# Patient Record
Sex: Female | Born: 1995 | Race: Black or African American | Hispanic: No | Marital: Single | Smoking: Never smoker
Health system: Southern US, Community
[De-identification: ages and names within clinical notes are randomized; demographics above are authoritative.]

## PROBLEM LIST (undated history)

## (undated) HISTORY — PX: FEMUR SURGERY: SHX943

---
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
## 2019-12-04 ENCOUNTER — Encounter: Payer: Self-pay | Admitting: Emergency Medicine

## 2019-12-04 ENCOUNTER — Other Ambulatory Visit: Payer: Self-pay

## 2019-12-04 ENCOUNTER — Emergency Department: Payer: Medicaid Other

## 2019-12-04 ENCOUNTER — Emergency Department
Admission: EM | Admit: 2019-12-04 | Discharge: 2019-12-04 | Disposition: A | Discharge status: Home / Self Care | Payer: Medicaid Other | Attending: Emergency Medicine | Admitting: Emergency Medicine

## 2019-12-04 DIAGNOSIS — O2311 Infections of bladder in pregnancy, first trimester: Secondary | ICD-10-CM | POA: Diagnosis not present

## 2019-12-04 DIAGNOSIS — M545 Low back pain: Secondary | ICD-10-CM | POA: Insufficient documentation

## 2019-12-04 DIAGNOSIS — Y92414 Local residential or business street as the place of occurrence of the external cause: Secondary | ICD-10-CM | POA: Diagnosis not present

## 2019-12-04 DIAGNOSIS — R109 Unspecified abdominal pain: Secondary | ICD-10-CM

## 2019-12-04 DIAGNOSIS — Y93I9 Activity, other involving external motion: Secondary | ICD-10-CM | POA: Insufficient documentation

## 2019-12-04 DIAGNOSIS — Y999 Unspecified external cause status: Secondary | ICD-10-CM | POA: Diagnosis not present

## 2019-12-04 DIAGNOSIS — O2312 Infections of bladder in pregnancy, second trimester: Secondary | ICD-10-CM

## 2019-12-04 DIAGNOSIS — Z3A01 Less than 8 weeks gestation of pregnancy: Secondary | ICD-10-CM | POA: Insufficient documentation

## 2019-12-04 DIAGNOSIS — O26899 Other specified pregnancy related conditions, unspecified trimester: Secondary | ICD-10-CM

## 2019-12-04 LAB — URINALYSIS, COMPLETE (UACMP) WITH MICROSCOPIC
Bilirubin Urine: NEGATIVE
Glucose, UA: NEGATIVE mg/dL
Hgb urine dipstick: NEGATIVE
Ketones, ur: 5 mg/dL — AB
Nitrite: NEGATIVE
Protein, ur: 30 mg/dL — AB
Specific Gravity, Urine: 1.034 — ABNORMAL HIGH (ref 1.005–1.030)
pH: 6 (ref 5.0–8.0)

## 2019-12-04 MED ORDER — CEPHALEXIN 500 MG PO CAPS: 500.0000 mg | ORAL_CAPSULE | Freq: Two times a day (BID) | ORAL | 0 refills | Status: AC
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
## 2019-12-04 NOTE — Discharge Instructions (Signed)
Please follow-up with your OB/GYN for any symptoms of concern.  Return to the emergency department if you have sudden onset of abdominal cramping, vaginal discharge, gush of fluids, or bleeding.

## 2019-12-07 LAB — URINE CULTURE: Culture: 10000 — AB
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
## 2023-07-02 ENCOUNTER — Ambulatory Visit
Admission: EM | Admit: 2023-07-02 | Discharge: 2023-07-02 | Disposition: A | Discharge status: Home / Self Care | Payer: Managed Care, Other (non HMO) | Attending: Nurse Practitioner | Admitting: Nurse Practitioner

## 2023-07-02 DIAGNOSIS — B349 Viral infection, unspecified: Secondary | ICD-10-CM

## 2023-07-02 DIAGNOSIS — Z1152 Encounter for screening for COVID-19: Secondary | ICD-10-CM | POA: Insufficient documentation

## 2023-07-02 LAB — POCT RAPID STREP A (OFFICE): Rapid Strep A Screen: NEGATIVE

## 2023-07-02 NOTE — ED Triage Notes (Signed)
Pt reports she has a scratchy throat, body aches, and hard to breath out of her mouth x 1 day.

## 2023-07-02 NOTE — Discharge Instructions (Addendum)
The rapid strep test was negative.  A throat culture and COVID test are pending.  You will be contacted if the pending test results are positive. Take medication as prescribed. Recommend over-the-counter ibuprofen or Tylenol as needed for pain, fever, or general discomfort. Increase fluids and allow for plenty of rest. Warm salt water gargles 3-4 times daily while symptoms persist. Also recommend a soft diet to include soup, broth, yogurt, pudding, or Jell-O while symptoms persist. Chloraseptic throat spray and throat lozenges are recommended to help with throat pain or discomfort. If symptoms or not improving over the next 7 to 10 days, or if they suddenly worsen, follow-up in this clinic or with your primary care physician for further evaluation. Follow-up as needed.

## 2023-07-02 NOTE — ED Provider Notes (Signed)
[facility]    CSN: [id number] Arrival date & time: 07/02/23  1206      History   Chief Complaint Chief Complaint  Patient presents with   Sore Throat    HPI Dominique Price is a 27 y.o. female.   The history is provided by the patient.   The patient presents with a 1 day history of scratchy throat, throat irritation, fatigue, and bodyaches.  Patient states that she has not had fever, headache, nasal congestion, runny nose, ear pain, cough, abdominal pain, nausea, vomiting, or diarrhea.  Patient states it is harder for her to breathe out of her mouth.  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  She reports she has not taken any medication for her symptoms.  Reports that she does have a history of pollen allergies, she does not take medication daily for that.  History reviewed. No pertinent past medical history.  There are no problems to display for this patient.   Past Surgical History:  Procedure Laterality Date   FEMUR SURGERY      OB History     Gravida  1   Para      Term      Preterm      AB      Living         SAB      IAB      Ectopic      Multiple      Live Births               Home Medications    Prior to Admission medications   Not on File    Family History History reviewed. No pertinent family history.  Social History Social History   Tobacco Use   Smoking status: Never  Substance Use Topics   Alcohol use: No   Drug use: No     Allergies   Patient has no known allergies.   Review of Systems Review of Systems Per HPI  Physical Exam Triage Vital Signs ED Triage Vitals [07/02/23 1211]  Encounter Vitals Group     BP 113/77     Systolic BP Percentile      Diastolic BP Percentile      Pulse Rate 93     Resp 20     Temp 98.4 F (36.9 C)     Temp Source Oral     SpO2 96 %     Weight      Height      Head Circumference      Peak  Flow      Pain Score 6     Pain Loc      Pain Education      Exclude from Growth Chart    No data found.  Updated Vital Signs BP 113/77 (BP Location: Right Arm)   Pulse 93   Temp 98.4 F (36.9 C) (Oral)   Resp 20   LMP 06/30/2023 Comment: start date  SpO2 96%   Visual Acuity Right Eye Distance:   Left Eye Distance:   Bilateral Distance:    Right Eye Near:   Left Eye Near:    Bilateral Near:     Physical Exam Vitals and nursing note reviewed.  Constitutional:      General: She is not in acute distress.    Appearance: She is well-developed.  HENT:  Head: Normocephalic.     Right Ear: Tympanic membrane and ear canal normal. Tympanic membrane is not erythematous.     Left Ear: Tympanic membrane and ear canal normal. Tympanic membrane is not erythematous.     Ears:     Comments: Moderate cerumen in bilateral ear canals    Nose: No congestion or rhinorrhea.     Mouth/Throat:     Mouth: Mucous membranes are moist.     Pharynx: Uvula midline. Posterior oropharyngeal erythema present. No pharyngeal swelling.     Tonsils: No tonsillar exudate.     Comments: Cobblestoning present to posterior oropharynx Eyes:     Conjunctiva/sclera: Conjunctivae normal.     Pupils: Pupils are equal, round, and reactive to light.  Cardiovascular:     Rate and Rhythm: Normal rate and regular rhythm.     Heart sounds: Normal heart sounds.  Pulmonary:     Effort: Pulmonary effort is normal. No respiratory distress.     Breath sounds: Normal breath sounds. No stridor. No wheezing, rhonchi or rales.  Abdominal:     General: Bowel sounds are normal.     Palpations: Abdomen is soft.     Tenderness: There is no abdominal tenderness.  Musculoskeletal:     Cervical back: Normal range of motion.  Lymphadenopathy:     Cervical: No cervical adenopathy.  Skin:    General: Skin is warm and dry.  Neurological:     General: No focal deficit present.     Mental Status: She is alert and oriented  to person, place, and time.  Psychiatric:        Mood and Affect: Mood normal.        Behavior: Behavior normal.      UC Treatments / Results  Labs (all labs ordered are listed, but only abnormal results are displayed) Labs Reviewed  SARS CORONAVIRUS 2 (TAT 6-24 HRS)  POCT RAPID STREP A (OFFICE)    EKG   Radiology No results found.  Procedures Procedures (including critical care time)  Medications Ordered in UC Medications - No data to display  Initial Impression / Assessment and Plan / UC Course  I have reviewed the triage vital signs and the nursing notes.  Pertinent labs & imaging results that were available during my care of the patient were reviewed by me and considered in my medical decision making (see chart for details).  The patient is well-appearing, she is in no acute distress, vital signs are stable.  Rapid strep test is negative, throat culture and COVID test are pending.  The patient is a candidate to receive Paxlovid if her COVID test is positive.  Suspect symptoms are most likely of viral etiology based on patient's current presentation.  Will start patient on cetirizine 10 mg daily for allergy symptoms.  Supportive care recommendations were provided and discussed with the patient to include over-the-counter analgesics for pain or discomfort, warm salt water gargles, and increasing fluids and allowing for plenty of rest.  Patient was advised that if symptoms or not improving over the next 7 to 10 days, or if they are worsening, to follow-up in this clinic or with her primary care physician for further evaluation.  Patient is in agreement with this plan of care and verbalizes understanding.  All questions were answered.  Patient stable for discharge.   Final Clinical Impressions(s) / UC Diagnoses   Final diagnoses:  Viral illness  Encounter for screening for COVID-19     Discharge Instructions  The rapid strep test was negative.  A throat culture  and COVID test are pending.  You will be contacted if the pending test results are positive. Take medication as prescribed. Recommend over-the-counter ibuprofen or Tylenol as needed for pain, fever, or general discomfort. Increase fluids and allow for plenty of rest. Warm salt water gargles 3-4 times daily while symptoms persist. Also recommend a soft diet to include soup, broth, yogurt, pudding, or Jell-O while symptoms persist. Chloraseptic throat spray and throat lozenges are recommended to help with throat pain or discomfort. If symptoms or not improving over the next 7 to 10 days, or if they suddenly worsen, follow-up in this clinic or with your primary care physician for further evaluation. Follow-up as needed.     ED Prescriptions   None    PDMP not reviewed this encounter.   Abran Cantor, NP 07/02/23 1255

## 2023-07-03 LAB — SARS CORONAVIRUS 2 (TAT 6-24 HRS): SARS Coronavirus 2: POSITIVE — AB

## 2023-07-04 LAB — CULTURE, GROUP A STREP (THRC)

## 2023-07-05 LAB — CULTURE, GROUP A STREP (THRC)
